# Patient Record
Sex: Male | Born: 1995 | Race: White | Hispanic: No | Smoking: Current every day smoker
Health system: Southern US, Community
[De-identification: ages and names within clinical notes are randomized; demographics above are authoritative.]

## PROBLEM LIST (undated history)

## (undated) DIAGNOSIS — I10 Essential (primary) hypertension: Secondary | ICD-10-CM

## (undated) HISTORY — DX: Essential (primary) hypertension: I10

## (undated) HISTORY — PX: KNEE SURGERY: SHX244

## (undated) HISTORY — PX: WRIST SURGERY: SHX841

---

## 2019-06-11 ENCOUNTER — Telehealth: Payer: Self-pay | Admitting: Physician Assistant

## 2019-06-22 ENCOUNTER — Ambulatory Visit: Payer: Self-pay | Admitting: Physician Assistant

## 2019-06-22 ENCOUNTER — Ambulatory Visit (HOSPITAL_COMMUNITY)
Admission: RE | Admit: 2019-06-22 | Discharge: 2019-06-22 | Disposition: A | Discharge status: Home / Self Care | Payer: Self-pay | Source: Ambulatory Visit | Attending: Physician Assistant | Admitting: Physician Assistant

## 2019-06-22 ENCOUNTER — Encounter: Payer: Self-pay | Admitting: Physician Assistant

## 2019-06-22 ENCOUNTER — Other Ambulatory Visit: Payer: Self-pay

## 2019-06-22 ENCOUNTER — Other Ambulatory Visit (HOSPITAL_COMMUNITY)
Admission: RE | Admit: 2019-06-22 | Discharge: 2019-06-22 | Disposition: A | Discharge status: Home / Self Care | Payer: Self-pay | Source: Ambulatory Visit | Attending: Physician Assistant | Admitting: Physician Assistant

## 2019-06-22 VITALS — BP 156/70 | HR 122 | Temp 97.9°F | Ht 69.0 in | Wt 200.0 lb

## 2019-06-22 DIAGNOSIS — Z7689 Persons encountering health services in other specified circumstances: Secondary | ICD-10-CM

## 2019-06-22 DIAGNOSIS — G8929 Other chronic pain: Secondary | ICD-10-CM

## 2019-06-22 DIAGNOSIS — R000 Tachycardia, unspecified: Secondary | ICD-10-CM | POA: Insufficient documentation

## 2019-06-22 DIAGNOSIS — M5442 Lumbago with sciatica, left side: Secondary | ICD-10-CM | POA: Insufficient documentation

## 2019-06-22 DIAGNOSIS — R030 Elevated blood-pressure reading, without diagnosis of hypertension: Secondary | ICD-10-CM

## 2019-06-22 DIAGNOSIS — F17200 Nicotine dependence, unspecified, uncomplicated: Secondary | ICD-10-CM

## 2019-06-22 LAB — COMPREHENSIVE METABOLIC PANEL
ALT: 19 U/L (ref 0–44)
AST: 15 U/L (ref 15–41)
Albumin: 4.9 g/dL (ref 3.5–5.0)
Alkaline Phosphatase: 73 U/L (ref 38–126)
Anion gap: 12 (ref 5–15)
BUN: 9 mg/dL (ref 6–20)
CO2: 22 mmol/L (ref 22–32)
Calcium: 9.6 mg/dL (ref 8.9–10.3)
Chloride: 106 mmol/L (ref 98–111)
Creatinine, Ser: 0.86 mg/dL (ref 0.61–1.24)
GFR calc Af Amer: >60 mL/min (ref >60)
GFR calc non Af Amer: >60 mL/min (ref >60)
Glucose, Bld: 97 mg/dL (ref 70–99)
Potassium: 3.6 mmol/L (ref 3.5–5.1)
Sodium: 140 mmol/L (ref 135–145)
Total Bilirubin: 0.8 mg/dL (ref 0.3–1.2)
Total Protein: 8.2 g/dL — ABNORMAL HIGH (ref 6.5–8.1)

## 2019-06-22 LAB — TSH: TSH: 0.77 u[IU]/mL (ref 0.350–4.500)

## 2019-06-22 MED ORDER — PREDNISONE 10 MG PO TABS: ORAL_TABLET | ORAL | 0 refills | Status: DC

## 2019-06-22 NOTE — Progress Notes (Signed)
BP (!) 156/70   Pulse (!) 122   Temp 97.9 F (36.6 C)   Ht 5\' 9"  (1.753 m)   Wt 200 lb (90.7 kg)   SpO2 98%   BMI 29.53 kg/m    Subjective:    Patient ID: Joshua Jacobson, male    DOB: 1996/09/07, 23 y.o.   MRN: [id number]  HPI: Joshua Jacobson is a 23 y.o. male presenting on 06/22/2019 for New Patient (Initial Visit) and Back Pain (started about 3 months ago when at work when lifting a tin pan full of springs. )   HPI  Chief Complaint  Patient presents with  . New Patient (Initial Visit)  . Back Pain    started about 3 months ago when at work when lifting a tin pan full of springs.      Pt had negative screeningquestionnairefor CV19   Pt complains of back pain.   He says he got injured while at work.   Pt works at [employer] and stand   and his employer sent him to  urgent care in [place].  He Got 2 rx  (flexeril and IBU)   and was dx with lumbar strain   Pt is not working currently  Pt states Pain averages 4/10,  At times it's a 10.  Now it's about a 4  Pt denies drugs, energy drinks.  Pt says his bp has always run high.    Pt denies CP, SOB.   Back hurts more to the Left.  Pt states it burns into left leg to the foot.  It interferes with his sleep.  No problems walking except it makes it hurt.    Pt says he has always had a fast pulse   Pt lived in [place] until age 40  Relevant past medical, surgical, family and social history reviewed and updated as indicated. Interim medical history since our last visit reviewed. Allergies and medications reviewed and updated.  No current outpatient medications on file.   Review of Systems  Per HPI unless specifically indicated above     Objective:    BP (!) 156/70   Pulse (!) 122   Temp 97.9 F (36.6 C)   Ht 5\' 9"  (1.753 m)   Wt 200 lb (90.7 kg)   SpO2 98%   BMI 29.53 kg/m   Wt Readings from Last 3 Encounters:  06/22/19 200 lb (90.7 kg)    Physical Exam Vitals signs reviewed.   Constitutional:      Appearance: He is well-developed.  HENT:     Head: Normocephalic and atraumatic.  Eyes:     Conjunctiva/sclera: Conjunctivae normal.     Pupils: Pupils are equal, round, and reactive to light.  Neck:     Musculoskeletal: Neck supple.     Thyroid: No thyromegaly.  Cardiovascular:     Rate and Rhythm: Normal rate and regular rhythm.  Pulmonary:     Effort: Pulmonary effort is normal.     Breath sounds: Normal breath sounds. No wheezing or rales.  Abdominal:     General: Bowel sounds are normal.     Palpations: Abdomen is soft. There is no mass.     Tenderness: There is no abdominal tenderness.  Lymphadenopathy:     Cervical: No cervical adenopathy.  Skin:    General: Skin is warm and dry.     Findings: No rash.  Neurological:     Mental Status: He is alert and oriented to person, place, and time.  Psychiatric:  Behavior: Behavior normal.        Thought Content: Thought content normal.      Ekg- ST at 109.  No st-t changes. No previous for comparison   No results found for this or any previous visit.    Assessment & Plan:    Encounter Diagnoses  Name Primary?  . Encounter to establish care Yes  . Elevated blood pressure reading   . Chronic left-sided low back pain with left-sided sciatica   . Tachycardia   . Tobacco use disorder       -will check  Baseline labs  -Will get xray of lumbar spine.  Pt is given rx prednisone taper  -pt is given application [facility]  -discussed blood pressure with pt and he feels like it is usually good and is just up today due to pain and perhaps some nervous.  He does not want to start medication for this today because he feels like it will be normal next time.  Pt is given reading information on HTN  -pt is encouraged to wear mask in public per CDC guidelines  -pt to have follow up in 1 month.  He is to contact office sooner prn

## 2019-06-22 NOTE — Patient Instructions (Signed)

## 2019-07-20 ENCOUNTER — Other Ambulatory Visit: Payer: Self-pay

## 2019-07-20 ENCOUNTER — Encounter: Payer: Self-pay | Admitting: Physician Assistant

## 2019-07-20 ENCOUNTER — Ambulatory Visit: Payer: Self-pay | Admitting: Physician Assistant

## 2019-07-20 VITALS — BP 138/86 | HR 107 | Temp 99.7°F | Wt 188.8 lb

## 2019-07-20 DIAGNOSIS — G8929 Other chronic pain: Secondary | ICD-10-CM

## 2019-07-20 DIAGNOSIS — F17200 Nicotine dependence, unspecified, uncomplicated: Secondary | ICD-10-CM

## 2019-07-20 NOTE — Progress Notes (Signed)
BP 138/86   Pulse (!) 107   Temp 99.7 F (37.6 C)   Wt 188 lb 12.8 oz (85.6 kg)   SpO2 98%   BMI 27.88 kg/m    Subjective:    Patient ID: Joshua Jacobson, male    DOB: 1996/01/26, 23 y.o.   MRN: [id number]  HPI: Joshua Jacobson is a 23 y.o. male presenting on 07/20/2019 for Hypertension and Back Pain   HPI  Pt had a negative covid 19 screening questionnaire    Pt says prednisone that he was given at last office visit helped some but not much.  He is still having LBP with radiation into the left foot and leg  He says he got approved [facility]  He has no other complaints    Relevant past medical, surgical, family and social history reviewed and updated as indicated. Interim medical history since our last visit reviewed. Allergies and medications reviewed and updated.  Review of Systems  Per HPI unless specifically indicated above     Objective:    BP 138/86   Pulse (!) 107   Temp 99.7 F (37.6 C)   Wt 188 lb 12.8 oz (85.6 kg)   SpO2 98%   BMI 27.88 kg/m   Wt Readings from Last 3 Encounters:  07/20/19 188 lb 12.8 oz (85.6 kg)  06/22/19 200 lb (90.7 kg)    Physical Exam Vitals signs reviewed.  Constitutional:      General: He is not in acute distress.    Appearance: Normal appearance. He is well-developed. He is not ill-appearing.  HENT:     Head: Normocephalic and atraumatic.  Neck:     Musculoskeletal: Neck supple.  Cardiovascular:     Rate and Rhythm: Normal rate and regular rhythm.     Pulses:          Dorsalis pedis pulses are 2+ on the right side and 2+ on the left side.  Pulmonary:     Effort: Pulmonary effort is normal.     Breath sounds: Normal breath sounds. No wheezing.  Abdominal:     General: Bowel sounds are normal.     Palpations: Abdomen is soft.     Tenderness: There is no abdominal tenderness.  Musculoskeletal:     Lumbar back: He exhibits tenderness. He exhibits normal range of motion.     Right lower leg: No edema.      Left lower leg: No edema.     Comments: Mild soft tissue tenderness.  Some discomfort with SLR testing past 45 degrees.    Lymphadenopathy:     Cervical: No cervical adenopathy.  Skin:    General: Skin is warm and dry.  Neurological:     Mental Status: He is alert and oriented to person, place, and time.  Psychiatric:        Attention and Perception: Attention normal.        Speech: Speech normal.        Behavior: Behavior is cooperative.        Results for orders placed or performed during the hospital encounter of 06/22/19  Comprehensive metabolic panel  Result Value Ref Range   Sodium 140 135 - 145 mmol/L   Potassium 3.6 3.5 - 5.1 mmol/L   Chloride 106 98 - 111 mmol/L   CO2 22 22 - 32 mmol/L   Glucose, Bld 97 70 - 99 mg/dL   BUN 9 6 - 20 mg/dL   Creatinine, Ser 4.090.86 0.61 - 1.24  mg/dL   Calcium 9.6 8.9 - 10.3 mg/dL   Total Protein 8.2 (H) 6.5 - 8.1 g/dL   Albumin 4.9 3.5 - 5.0 g/dL   AST 15 15 - 41 U/L   ALT 19 0 - 44 U/L   Alkaline Phosphatase 73 38 - 126 U/L   Total Bilirubin 0.8 0.3 - 1.2 mg/dL   GFR calc non Af Amer >60 >60 mL/min   GFR calc Af Amer >60 >60 mL/min   Anion gap 12 5 - 15  TSH  Result Value Ref Range   TSH 0.770 0.350 - 4.500 uIU/mL      Assessment & Plan:    Encounter Diagnoses  Name Primary?  . Chronic left-sided low back pain with left-sided sciatica Yes  . Tobacco use disorder     -reviewed labs with pt.  Reviewed lumbar xray with pt -pt's bp is improved -will Refer to orthopedics for further evaluation and treatment of LBP  He is to use IBU or aleve and ice/heat for analgesic. -counseled smoking cessation -encouraged pt to wear a mask when in public and avoid crowds per CDC guidelines -pt to follow up 1 year.  He is to contact office sooner prn

## 2019-07-28 ENCOUNTER — Telehealth: Payer: Self-pay | Admitting: Orthopedic Surgery

## 2019-07-28 NOTE — Telephone Encounter (Signed)
Aug 10

## 2019-07-28 NOTE — Telephone Encounter (Signed)
Spoke with patient regarding referral received from Soyla Dryer at [facility] for problem of left sided low back pain/left sided sciatica. Xrays were done at [facility]. Please review and advise. (Patient relays he has been approved for [facility] 100%discount.)  [phone].

## 2019-07-30 NOTE — Telephone Encounter (Signed)
Called patient to notify, and to offer appointment.

## 2019-08-06 ENCOUNTER — Other Ambulatory Visit: Payer: Self-pay

## 2019-08-06 ENCOUNTER — Emergency Department (HOSPITAL_COMMUNITY): Payer: Self-pay

## 2019-08-06 ENCOUNTER — Emergency Department (HOSPITAL_COMMUNITY)
Admission: EM | Admit: 2019-08-06 | Discharge: 2019-08-06 | Disposition: A | Discharge status: Home / Self Care | Payer: Self-pay | Attending: Emergency Medicine | Admitting: Emergency Medicine

## 2019-08-06 ENCOUNTER — Encounter (HOSPITAL_COMMUNITY): Payer: Self-pay | Admitting: Emergency Medicine

## 2019-08-06 DIAGNOSIS — I10 Essential (primary) hypertension: Secondary | ICD-10-CM | POA: Insufficient documentation

## 2019-08-06 DIAGNOSIS — R509 Fever, unspecified: Secondary | ICD-10-CM | POA: Insufficient documentation

## 2019-08-06 DIAGNOSIS — Z20828 Contact with and (suspected) exposure to other viral communicable diseases: Secondary | ICD-10-CM | POA: Insufficient documentation

## 2019-08-06 DIAGNOSIS — F17210 Nicotine dependence, cigarettes, uncomplicated: Secondary | ICD-10-CM | POA: Insufficient documentation

## 2019-08-06 LAB — BASIC METABOLIC PANEL
Anion gap: 10 (ref 5–15)
BUN: 12 mg/dL (ref 6–20)
CO2: 23 mmol/L (ref 22–32)
Calcium: 9.1 mg/dL (ref 8.9–10.3)
Chloride: 104 mmol/L (ref 98–111)
Creatinine, Ser: 0.84 mg/dL (ref 0.61–1.24)
GFR calc Af Amer: >60 mL/min (ref >60)
GFR calc non Af Amer: >60 mL/min (ref >60)
Glucose, Bld: 121 mg/dL — ABNORMAL HIGH (ref 70–99)
Potassium: 4.2 mmol/L (ref 3.5–5.1)
Sodium: 137 mmol/L (ref 135–145)

## 2019-08-06 LAB — CBC WITH DIFFERENTIAL/PLATELET
Abs Immature Granulocytes: 0.1 10*3/uL — ABNORMAL HIGH (ref 0.00–0.07)
Basophils Absolute: 0.1 10*3/uL (ref 0.0–0.1)
Basophils Relative: 1 %
Eosinophils Absolute: 0 10*3/uL (ref 0.0–0.5)
Eosinophils Relative: 0 %
HCT: 49.7 % (ref 39.0–52.0)
Hemoglobin: 16.9 g/dL (ref 13.0–17.0)
Immature Granulocytes: 1 %
Lymphocytes Relative: 41 %
Lymphs Abs: 4 10*3/uL (ref 0.7–4.0)
MCH: 30.3 pg (ref 26.0–34.0)
MCHC: 34 g/dL (ref 30.0–36.0)
MCV: 89.1 fL (ref 80.0–100.0)
Monocytes Absolute: 0.4 10*3/uL (ref 0.1–1.0)
Monocytes Relative: 4 %
Neutro Abs: 5.1 10*3/uL (ref 1.7–7.7)
Neutrophils Relative %: 53 %
Platelets: 168 10*3/uL (ref 150–400)
RBC: 5.58 MIL/uL (ref 4.22–5.81)
RDW: 12.9 % (ref 11.5–15.5)
WBC Morphology: ABNORMAL
WBC: 9.8 10*3/uL (ref 4.0–10.5)
nRBC: 0 % (ref 0.0–0.2)

## 2019-08-06 LAB — SARS CORONAVIRUS 2 BY RT PCR (HOSPITAL ORDER, PERFORMED IN ~~LOC~~ HOSPITAL LAB): SARS Coronavirus 2: NEGATIVE

## 2019-08-06 MED ORDER — DOXYCYCLINE HYCLATE 100 MG PO CAPS: 100.0000 mg | ORAL_CAPSULE | Freq: Two times a day (BID) | ORAL | 0 refills | Status: AC

## 2019-08-06 MED ORDER — DOXYCYCLINE HYCLATE 100 MG PO TABS
100.0000 mg | ORAL_TABLET | Freq: Once | ORAL | Status: AC
  Administered 2019-08-06: 22:00:00 100 mg via ORAL
  Filled 2019-08-06: qty 1

## 2019-08-06 NOTE — ED Triage Notes (Signed)
Fever x 4 days, highest temp 104.  Pt has been taking some leftover abx and prednisone.  Also taking asa for fever.  Denies any other s/s.  Last took 2 asa 1430 (unsure of the mg)

## 2019-08-06 NOTE — Discharge Instructions (Signed)
Your lab tests so far are normal here, as is your chest x-ray.  As discussed there are some lab tests that are still pending including testing for possible tick illness such as Lyme disease or [facility] spotted fever.  Your blood cultures are also still pending.  While we are waiting for these test to come back start taking the doxycycline as prescribed taking your next dose tomorrow morning as you have received this evening's dose here.  Continue treating your fever with Tylenol or ibuprofen.  Plan to see your doctor for recheck if your fevers persist or you develop any new symptoms.

## 2019-08-06 NOTE — ED Provider Notes (Signed)
[facility] EMERGENCY DEPARTMENT Provider Note   CSN: [id number] Arrival date & time: 08/06/19  1719    History   Chief Complaint Chief Complaint  Patient presents with  . Fever    HPI Joshua Jacobson is a 23 y.o. male with a past medical history of HTN, not currently on medication and current chronic low back pain from a work related injury presenting with a 4 day history of fevers with tmax of 104. He reports his fevers are gone during the day, but return in the evening hours nightly x 4 days.  Additionally reports a 3 day history of n/v which seems to have resolved today as he has been able to tolerate PO intake today. He denies cp or sob, he does have non productive cough which he states is his chronic smokers cough.  He denies abdominal pain, dysuria, back or flank pain, no diarrhea. Also denies sore throat, neck pain or stiffness, headache or myalgia, no rash, denies tick bites but does have a dog and frequently removes ticks from him.  No Covid 19 exposure.  He does have chronic dental decay but denies dental pain or gingival swelling.  He has been taking prednisone which was prescribed for his back pain, also has taken leftover amoxil for the past 3 days.  Has taken aspirin as well with adequate fever response, last took just prior to arrival.      The history is provided by the patient.    Past Medical History:  Diagnosis Date  . Hypertension     There are no active problems to display for this patient.   Past Surgical History:  Procedure Laterality Date  . KNEE SURGERY Right    x4  . WRIST SURGERY Right         Home Medications    Prior to Admission medications   Not on File    Family History Family History  Problem Relation Age of Onset  . Hypertension Mother   . Heart disease Mother   . Hypertension Father   . Heart disease Father   . Asthma Father     Social History Social History   Tobacco Use  . Smoking status: Current Every Day Smoker   Packs/day: 0.25    Years: 12.00    Pack years: 3.00  . Smokeless tobacco: Former NeurosurgeonUser    Types: Chew    Quit date: 2010  Substance Use Topics  . Alcohol use: Yes    Comment: occasional beer  . Drug use: Never     Allergies   Codeine   Review of Systems Review of Systems  Constitutional: Positive for diaphoresis, fatigue and fever.  HENT: Negative for congestion, facial swelling, rhinorrhea and sore throat.   Eyes: Negative.   Respiratory: Negative for chest tightness and shortness of breath.   Cardiovascular: Negative for chest pain.  Gastrointestinal: Positive for nausea and vomiting. Negative for abdominal pain and diarrhea.  Genitourinary: Negative.  Negative for dysuria.  Musculoskeletal: Positive for back pain. Negative for arthralgias, joint swelling, neck pain and neck stiffness.  Skin: Negative.  Negative for rash and wound.  Neurological: Negative for dizziness, weakness, light-headedness, numbness and headaches.  Psychiatric/Behavioral: Negative.      Physical Exam Updated Vital Signs BP 133/73   Pulse 100   Temp 99.9 F (37.7 C) (Oral)   Resp 18   Ht 5\' 9"  (1.753 m)   Wt 90.7 kg   SpO2 100%   BMI 29.53 kg/m   Physical  Exam Vitals signs and nursing note reviewed.  Constitutional:      General: He is not in acute distress.    Appearance: He is well-developed. He is diaphoretic.  HENT:     Head: Normocephalic and atraumatic.     Nose: No congestion or rhinorrhea.     Mouth/Throat:     Mouth: Mucous membranes are moist.     Pharynx: No oropharyngeal exudate or posterior oropharyngeal erythema.     Comments: Mild chronic dental decay right lower molars. No gingival edema.  Eyes:     Conjunctiva/sclera: Conjunctivae normal.  Neck:     Musculoskeletal: Normal range of motion.  Cardiovascular:     Rate and Rhythm: Regular rhythm. Tachycardia present.     Heart sounds: Normal heart sounds.  Pulmonary:     Effort: Pulmonary effort is normal.      Breath sounds: Normal breath sounds. No wheezing.  Abdominal:     General: Bowel sounds are normal.     Palpations: Abdomen is soft.     Tenderness: There is no abdominal tenderness.  Musculoskeletal: Normal range of motion.  Skin:    General: Skin is warm.  Neurological:     Mental Status: He is alert.      ED Treatments / Results  Labs (all labs ordered are listed, but only abnormal results are displayed) Labs Reviewed  CBC WITH DIFFERENTIAL/PLATELET - Abnormal; Notable for the following components:      Result Value   Abs Immature Granulocytes 0.10 (*)    All other components within normal limits  BASIC METABOLIC PANEL - Abnormal; Notable for the following components:   Glucose, Bld 121 (*)    All other components within normal limits  SARS CORONAVIRUS 2 (HOSPITAL ORDER, PERFORMED IN [address] [facility])  CULTURE, BLOOD (ROUTINE X 2)  CULTURE, BLOOD (ROUTINE X 2)  ROCKY MTN SPOTTED FVR ABS PNL(IGG+IGM)  LYME DISEASE DNA BY PCR(BORRELIA BURG)    EKG None  Radiology Dg Chest Portable 1 View  Result Date: 08/06/2019 CLINICAL DATA:  Fever.  Cough. EXAM: PORTABLE CHEST 1 VIEW COMPARISON:  None. FINDINGS: The heart size and mediastinal contours are within normal limits. Both lungs are clear. The visualized skeletal structures are unremarkable. IMPRESSION: Normal. Electronically Signed   By: Francene BoyersJames  Maxwell M.D.   On: 08/06/2019 18:51    Procedures Procedures (including critical care time)  Medications Ordered in ED Medications - No data to display   Initial Impression / Assessment and Plan / ED Course  I have reviewed the triage vital signs and the nursing notes.  Pertinent labs & imaging results that were available during my care of the patient were reviewed by me and considered in my medical decision making (see chart for details).        Febrile illness of unclear etiology.  He has no specific sx except for fever.  Covid 10 negative today and normal blood  work.  He has potential for tick borne illness, stating has a dog, last tick he was removed (from the dog) was 3 weeks ago, to his knowledge has not had a tick bite.  RMSF and Lyme ordered as well as blood cultures.  Pt looked clinically well, will cover with doxycycline pending lab tests.  Plan f/u with pcp for a recheck for any new or persistent sx in the interim.   Final Clinical Impressions(s) / ED Diagnoses   Final diagnoses:  Febrile illness    ED Discharge Orders  None       Landis Martins 08/06/19 2318    Milton Ferguson, MD 08/07/19 [id number]

## 2019-08-10 ENCOUNTER — Other Ambulatory Visit: Payer: Self-pay

## 2019-08-10 ENCOUNTER — Encounter: Payer: Self-pay | Admitting: Orthopedic Surgery

## 2019-08-10 ENCOUNTER — Ambulatory Visit (INDEPENDENT_AMBULATORY_CARE_PROVIDER_SITE_OTHER): Payer: Self-pay | Admitting: Orthopedic Surgery

## 2019-08-10 ENCOUNTER — Telehealth: Payer: Self-pay | Admitting: Student

## 2019-08-10 VITALS — BP 130/80 | HR 105 | Ht 69.0 in | Wt 196.0 lb

## 2019-08-10 DIAGNOSIS — M5410 Radiculopathy, site unspecified: Secondary | ICD-10-CM

## 2019-08-10 MED ORDER — GABAPENTIN 100 MG PO CAPS: 100.0000 mg | ORAL_CAPSULE | Freq: Three times a day (TID) | ORAL | 2 refills | Status: AC

## 2019-08-10 NOTE — Progress Notes (Signed)
Joshua Jacobson  08/10/2019  HISTORY SECTION :  Chief Complaint  Patient presents with  . Back Pain    injury 03/10/2019/lifting a pan of [facility]   . Leg Pain    pain down left leg    HPI The patient presents for evaluation of lower back and left leg pain.  The patient picked up something at work felt something in his back felt acute pain let the object go and to go home that day but after a couple of days was not better eventually saw PA who gave him some prednisone took an x-ray x-ray did not show any acute process there was some degenerative disease  He now has 5032-month history of lower back and left leg radicular pain 3 at rest but becomes a 10 with coughing or with walking  Review of Systems  Musculoskeletal: Positive for back pain.  Neurological: Positive for sensory change. Negative for weakness.  All other systems reviewed and are negative.    Past Medical History:  Diagnosis Date  . Hypertension     Past Surgical History:  Procedure Laterality Date  . KNEE SURGERY Right    x4  . WRIST SURGERY Right      Allergies  Allergen Reactions  . Codeine Rash     Current Outpatient Medications:  .  doxycycline (VIBRAMYCIN) 100 MG capsule, Take 1 capsule (100 mg total) by mouth 2 (two) times daily., Disp: 20 capsule, Rfl: 0 .  gabapentin (NEURONTIN) 100 MG capsule, Take 1 capsule (100 mg total) by mouth 3 (three) times daily., Disp: 90 capsule, Rfl: 2   PHYSICAL EXAM SECTION: 1) BP 130/80   Pulse (!) 105   Ht 5\' 9"  (1.753 m)   Wt 196 lb (88.9 kg)   BMI 28.94 kg/m   Body mass index is 28.94 kg/m. General appearance: Well-developed well-nourished no gross deformities  2) Cardiovascular normal pulse and perfusion in all 4 extremities normal color without edema  3) Neurologically deep tendon reflexes are equal and normal, no sensation loss or deficits no pathologic reflexes  4) Psychological: Awake alert and oriented x3 mood and  affect normal  5) Skin no lacerations or ulcerations no nodularity no palpable masses, no erythema or nodularity  6) Musculoskeletal:   Lumbar spine tender midline lower and left side  Left leg straight leg is positive at 30 degrees.  Both legs have normal strength hip range of motion is normal bilaterally reflexes are 2+ at the knee 1+ to 2+ at the ankle bilaterally pulses are intact color capillary refill good no edema  MEDICAL DECISION SECTION:  Encounter Diagnosis  Name Primary?  . Radicular leg pain Yes    Imaging Outside film x-rays multiple views x5 lumbar spine no acute fracture or dislocation  See report included by reference  CLINICAL DATA:  Back pain.   EXAM: LUMBAR SPINE - COMPLETE 4+ VIEW   COMPARISON:  No prior.   FINDINGS: No acute soft tissue bony abnormality. No evidence of fracture. Mild degenerative changes L3-L4, L4-L5, L5-S1.   IMPRESSION: Mild degenerative changes L3-L4, L4-L5, L5-S1. No acute bony abnormality.     Electronically Signed   By: Maisie Fushomas  Register   On: 06/23/2019 08:24    Plan:  (Rx., Inj., surg., Frx, MRI/CT, XR:2)  Meds ordered this encounter  Medications  . gabapentin (NEURONTIN) 100 MG capsule    Sig: Take 1 capsule (100 mg total) by mouth 3 (three) times daily.  Dispense:  90 capsule    Refill:  2   MRI lumbar spine probable disc herniationMost likely L4-5  1:56 PM Arther Abbott, MD  08/10/2019

## 2019-08-10 NOTE — Patient Instructions (Signed)
Herniated Disk  A herniated disk, also called a ruptured disk or slipped disk, occurs when a disk in the spine bulges out too far. Between the bones in the spine (vertebrae), there are oval disks that are made of a soft, spongy center that is surrounded by a tough outer ring. The disks connect your vertebrae, help your spine move, and absorb shocks from your movement. When you have a herniated disk, the spongy center of the disk bulges out or breaks through the outer ring. It can press on a nerve between the vertebrae and cause pain. This can occur anywhere in the back or neck area, but the lower back is most commonly affected. What are the causes? This condition may be caused by:  Age-related wear and tear. The spongy centers of spinal disks tend to shrink and dry out with age, which makes them more likely to herniate.  Sudden injury, such as a strain or sprain. What increases the risk? Aging is the main risk factor for a herniated disk. Other risk factors include:  Being a man who is 30-50 years old.  Frequently doing activities that involve heavy lifting, bending, or twisting.  Frequently driving for long hours at a time.  Not getting enough exercise.  Being overweight.  Smoking.  Having a family history of back problems or herniated disks.  Being pregnant or giving birth.  Having poor nutrition.  Being tall. What are the signs or symptoms? Symptoms may vary depending on where your herniated disk is located.  A herniated disk in the lower back may cause sharp pain in: ? Part of the arm, leg, hip, or buttocks. ? The back of the lower leg (calf). ? The lower back, spreading down through the leg into the foot (sciatica).  A herniated disk in the neck may cause dizziness and vertigo. It may also cause pain or weakness in: ? The neck. ? The shoulder blades. ? Upper arm, forearm, or fingers.  You may also have muscle weakness. It may be difficult to: ? Lift your leg or arm.  ? Stand on your toes. ? Squeeze tightly with one of your hands.  Other symptoms may include: ? Numbness or tingling in the affected areas of the hands, arms, feet, or legs. ? Inability to control when you urinate or when you have bowel movements. This is a rare but serious sign of a severe herniated disk in the lower back. How is this diagnosed? This condition may be diagnosed based on:  Your symptoms.  Your medical history.  A physical exam. The exam may include: ? Straight-leg test. You will lie on your back while your health care provider lifts your leg, keeping your knee straight. If you feel pain, you likely have a herniated disk. ? Neurological tests. This includes checking for numbness, reflexes, muscle strength, and posture.  Imaging tests, such as: ? X-rays. ? MRI. ? CT scan. ? Electromyogram (EMG) to check the nerves that control muscles. This test may be used to determine which nerves are affected by your herniated disk. How is this treated? Treatment for this condition may include:  A short period of rest. This is usually the first treatment. ? You may be on bed rest for up to 2 days, or you may be instructed to stay home and avoid physical activity. ? If you have a herniated disk in your lower back, avoid sitting as much as possible. Sitting increases pressure on the disk.  Medicines. These may include: ? NSAIDs   to help reduce pain and swelling. ? Muscle relaxants to prevent sudden tightening of the back muscles (back spasms). ? Prescription pain medicines, if you have severe pain.  Steroid injections in the area of the herniated disk. This can help reduce pain and swelling.  Physical therapy to strengthen your back muscles. In many cases, symptoms go away with treatment over a period of days or weeks. You will most likely be free of symptoms after 3-4 months. If other treatments do not help to relieve your symptoms, you may need surgery. Follow these instructions  at home: Medicines  Take over-the-counter and prescription medicines only as told by your health care provider.  Do not drive or use heavy machinery while taking prescription pain medicine. Activity  Rest as directed.  After your rest period: ? Return to your normal activities and gradually begin exercising as told by your health care provider. Ask your health care provider what activities and exercises are safe for you. ? Use good posture. ? Avoid movements that cause pain. ? Do not lift anything that is heavier than 10 lb (4.5 kg) until your health care provider says this is safe. ? Do not sit or stand for long periods of time without changing positions. ? Do not sit for long periods of time without getting up and moving around.  If physical therapy was prescribed, do exercises as instructed.  Aim to strengthen muscles in your back and abdomen with exercises like crunches, swimming, or walking. General instructions  Do not use any products that contain nicotine or tobacco, such as cigarettes and e-cigarettes. These products can delay healing. If you need help quitting, ask your health care provider.  Do not wear high-heeled shoes.  Do not sleep on your belly.  If you are overweight, work with your health care provider to lose weight safely.  To prevent or treat constipation while you are taking prescription pain medicine, your health care provider may recommend that you: ? Drink enough fluid to keep your urine clear or pale yellow. ? Take over-the-counter or prescription medicines. ? Eat foods that are high in fiber, such as fresh fruits and vegetables, whole grains, and beans. ? Limit foods that are high in fat and processed sugars, such as fried and sweet foods.  Keep all follow-up visits as told by your health care provider. This is important. How is this prevented?   Maintain a healthy weight.  Try to avoid stressful situations.  Maintain physical fitness. Do at  least 150 minutes of moderate-intensity exercise each week, such as brisk walking or water aerobics.  When lifting objects: ? Keep your feet at least shoulder-width apart and tighten your abdominal muscles. ? Keep your spine neutral as you bend your knees and hips. It is important to lift using the strength of your legs, not your back. Do not lock your knees straight out. ? Always ask for help to lift heavy or awkward objects. Contact a health care provider if:  You have back pain or neck pain that does not get better after 6 weeks.  You have severe pain in your back, neck, legs, or arms.  You develop numbness, tingling, or weakness in any part of your body. Get help right away if:  You cannot move your arms or legs.  You cannot control when you urinate or have bowel movements.  You feel dizzy or you faint.  You have shortness of breath. This information is not intended to replace advice given to you by   your health care provider. Make sure you discuss any questions you have with your health care provider. Document Released: 12/14/2000 Document Revised: 11/29/2017 Document Reviewed: 08/13/2016 Elsevier Patient Education  2020 [employer]  

## 2019-08-11 LAB — CULTURE, BLOOD (ROUTINE X 2)
Culture: NO GROWTH
Special Requests: ADEQUATE

## 2019-08-11 LAB — ROCKY MTN SPOTTED FVR ABS PNL(IGG+IGM)
RMSF IgG: NEGATIVE
RMSF IgM: 0.25 {index} (ref 0.00–0.89)

## 2019-08-12 ENCOUNTER — Ambulatory Visit: Payer: Self-pay | Admitting: Orthopedic Surgery

## 2019-08-12 NOTE — Telephone Encounter (Signed)
Pt has not called back.  Closing encounter

## 2019-08-14 ENCOUNTER — Other Ambulatory Visit: Payer: Self-pay

## 2019-08-14 ENCOUNTER — Ambulatory Visit (HOSPITAL_COMMUNITY)
Admission: RE | Admit: 2019-08-14 | Discharge: 2019-08-14 | Disposition: A | Discharge status: Home / Self Care | Payer: Self-pay | Source: Ambulatory Visit | Attending: Orthopedic Surgery | Admitting: Orthopedic Surgery

## 2019-08-14 DIAGNOSIS — M5410 Radiculopathy, site unspecified: Secondary | ICD-10-CM | POA: Insufficient documentation

## 2019-08-19 LAB — CULTURE, BLOOD (ROUTINE X 2)
Culture: NO GROWTH
Special Requests: ADEQUATE

## 2019-08-21 ENCOUNTER — Telehealth: Payer: Self-pay | Admitting: Orthopedic Surgery

## 2019-08-21 DIAGNOSIS — M5410 Radiculopathy, site unspecified: Secondary | ICD-10-CM

## 2019-08-21 NOTE — Telephone Encounter (Signed)
Patient called about his MRI results from 08/14/19. Relays that he had phone problems, and was unsure if he was called.  Phone number (working again) [phone].

## 2019-08-24 NOTE — Telephone Encounter (Signed)
IMPRESSION: 1. 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 2. Additional left paracentral disc protrusion at L5-S1 narrows the left subarticular recess. Correlate for left S1 radiculopathy.   Electronically Signed   By: Marisue Brooklyn.D.

## 2019-08-25 NOTE — Telephone Encounter (Signed)
-----   Message from Carole Civil, MD sent at 08/25/2019 12:39 PM EDT ----- Needs referral to neurosurgery

## 2019-08-26 ENCOUNTER — Telehealth: Payer: Self-pay | Admitting: Orthopedic Surgery

## 2019-08-26 NOTE — Telephone Encounter (Signed)
Leah from [facility] called.  She said they cant accept with Medicaid Family Planning for this patient.  She asks that you call her  Leah--[facility] [phone] ext [phone]  Thanks

## 2019-08-27 ENCOUNTER — Telehealth: Payer: Self-pay | Admitting: Radiology

## 2019-08-27 NOTE — Telephone Encounter (Signed)
They will not accept hi, sent message to see if free clinic can help.

## 2019-08-27 NOTE — Telephone Encounter (Signed)
Called patient to advise referral sent to [facility]  He can call to schedule  Information faxed [phone]  Phone number to schedule is [phone]   Patient states he will call his lawyer I told him he needs to call [facility] to make appointment, he voiced understanding.

## 2019-08-27 NOTE — Telephone Encounter (Signed)
I can send to [facility], is this acceptable with you?

## 2019-08-27 NOTE — Telephone Encounter (Signed)
Neurosurgery has declined referral Patient is aware Dr Aline Brochure is aware Reached out to free clinic to see if they can help.

## 2019-08-27 NOTE — Telephone Encounter (Signed)
Left message for patient  Sent message to Soyla Dryer to see if she can help [place] with this, he needs neurosurgery referral.

## 2019-08-27 NOTE — Telephone Encounter (Signed)
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  I have advised patient, and sending message back to the free clinic, since they referred him, perhaps they can help get him in with a neurosurgeon.    Can you help with this?

## 2019-08-27 NOTE — Telephone Encounter (Signed)
He is self pay. I put this on his paperwork. Of course this is not family planning.   Need to call back after 9 am they are not open yet.

## 2019-08-31 ENCOUNTER — Telehealth: Payer: Self-pay | Admitting: Radiology

## 2019-08-31 NOTE — Telephone Encounter (Signed)
I have discussed with Abigail Butts. Normally primary care providers will help with these situations. Abigail Butts is going to help with the [facility] referral, she will contact our [facility] office and see if she can get the contact information and fax to the clinic.

## 2019-08-31 NOTE — Telephone Encounter (Signed)
Referral faxed now to [facility].  Will follow-up.

## 2019-08-31 NOTE — Telephone Encounter (Addendum)
I have advised patient of referral status, this is declined, but we are working on getting him set up elsewhere.

## 2019-08-31 NOTE — Telephone Encounter (Signed)
Can we discuss? 

## 2019-08-31 NOTE — Telephone Encounter (Signed)
[facility] has declined referral, they are not accepting external self pay patients  I am reaching out to [facility], to see if her clinic can get him set up for a neurosurgeon, since the [facility] and the [facility] have both declined him.

## 2019-09-11 ENCOUNTER — Telehealth: Payer: Self-pay | Admitting: Radiology

## 2019-09-11 NOTE — Telephone Encounter (Signed)
I called patient and s/w him about his referral to [facility].  See referral notes for details.   He is asking for a work note and said also that his lawyer's office was to have sent a request to [place] for his medical records.  He would like the work note sent with the medical records to the lawyer.  Call him with any questions, thanks.

## 2019-09-14 NOTE — Telephone Encounter (Signed)
Yes  And tell him to go to [facility]

## 2019-09-14 NOTE — Telephone Encounter (Signed)
Ok but my advice is to go to the er

## 2019-09-14 NOTE — Telephone Encounter (Signed)
He has [employer] lumbar / self pay, we have had difficulty finding a neurosurgeon to evaluate him, and his primary care will not assist.  He needs work note. Ok to provide out of work note until he can be evaluated by Neurosurgeon?

## 2019-09-14 NOTE — Telephone Encounter (Signed)
He has been told by his attorney to file Workers comp, there is a hearing of some sort scheduled for 10/08/19

## 2019-09-15 NOTE — Telephone Encounter (Signed)
I advised him ER is appropriate if he continues pain, and needs attention sooner than he can schedule. He voiced understanding  Work note at front desk he will pick up

## 2020-05-31 IMAGING — DX LUMBAR SPINE - COMPLETE 4+ VIEW
5 series · 5 of 5 positions shown · non-contrast
Comparison: No prior.

CLINICAL DATA: Back pain.

EXAM:
LUMBAR SPINE - COMPLETE 4+ VIEW

[l-spine ap]
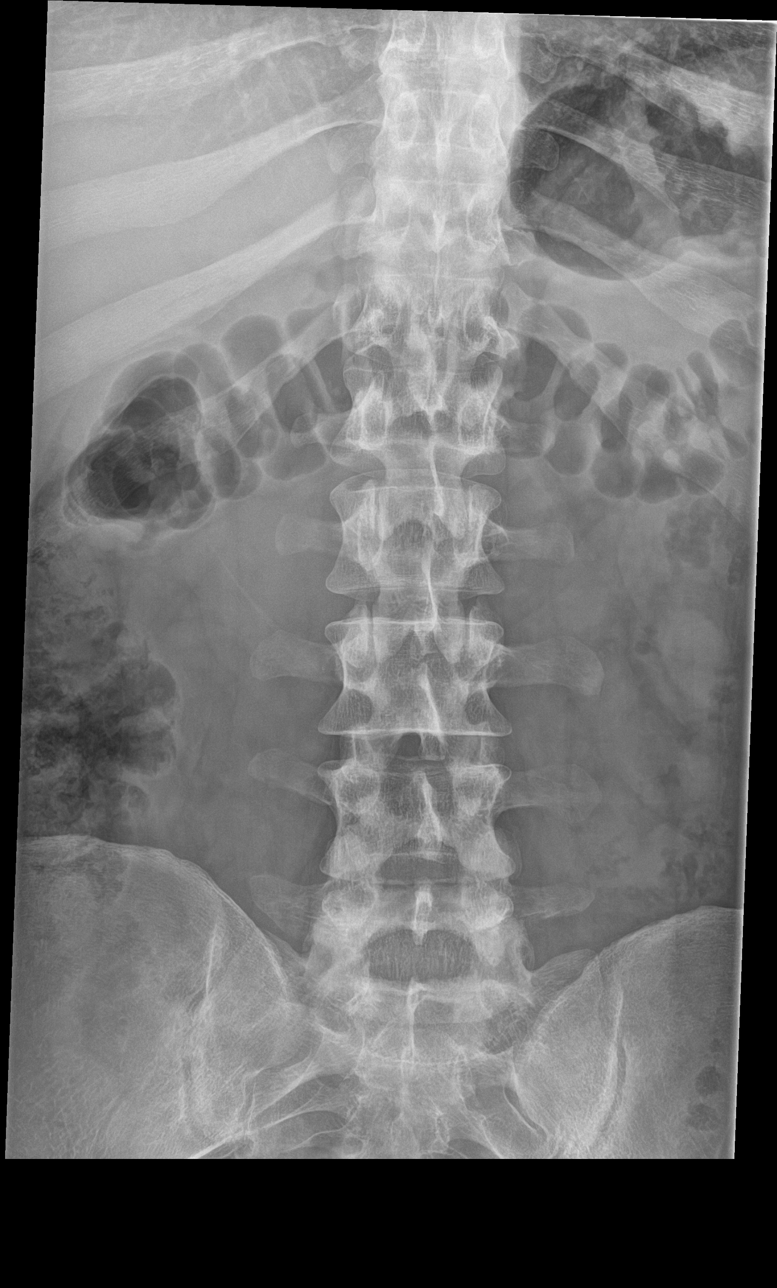

[l-spine obl (1 of 2)]
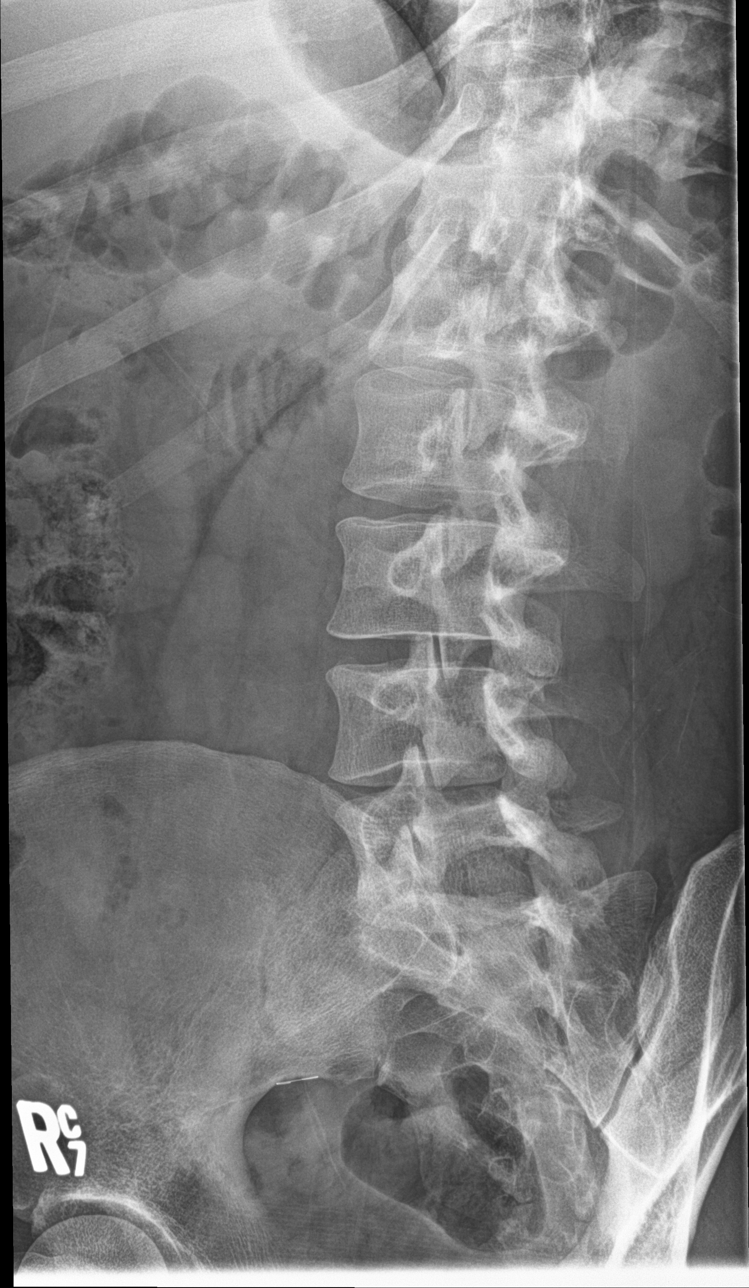

[l-spine obl (2 of 2)]
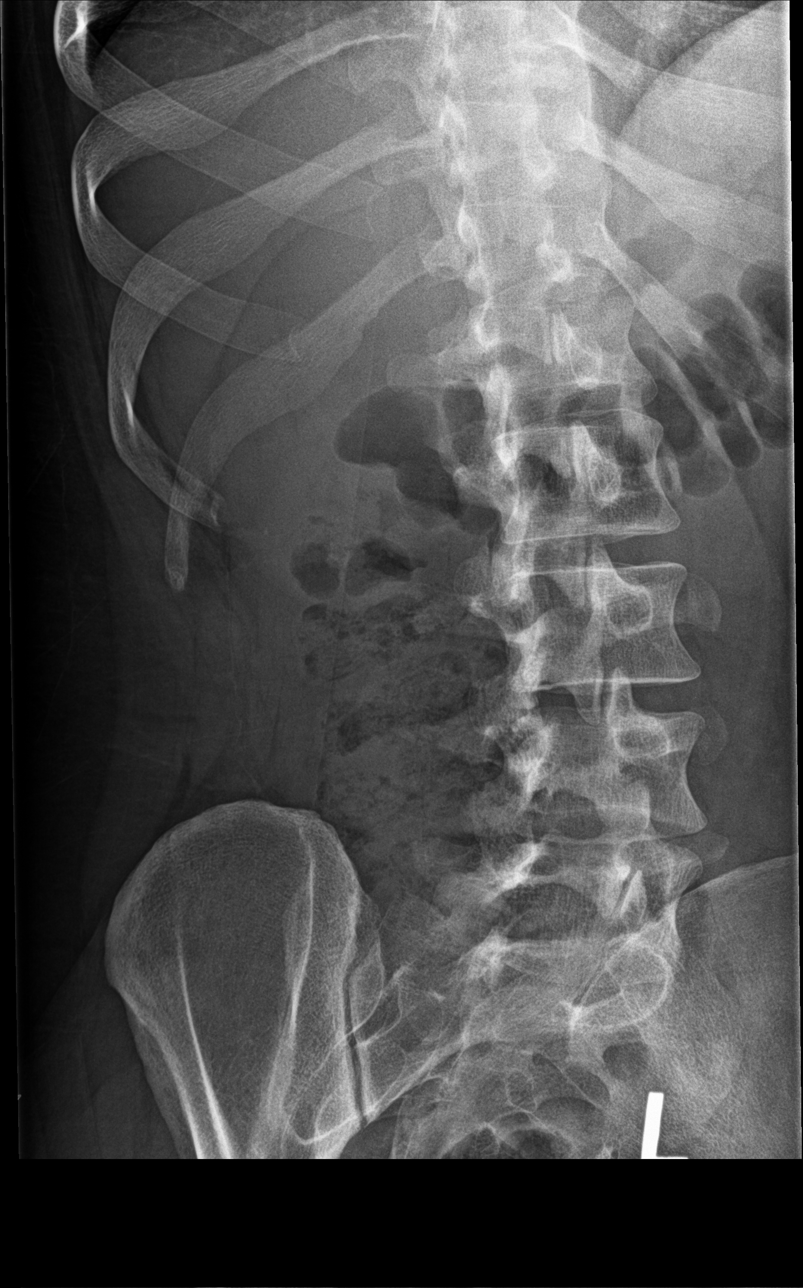

[l-spine lat]
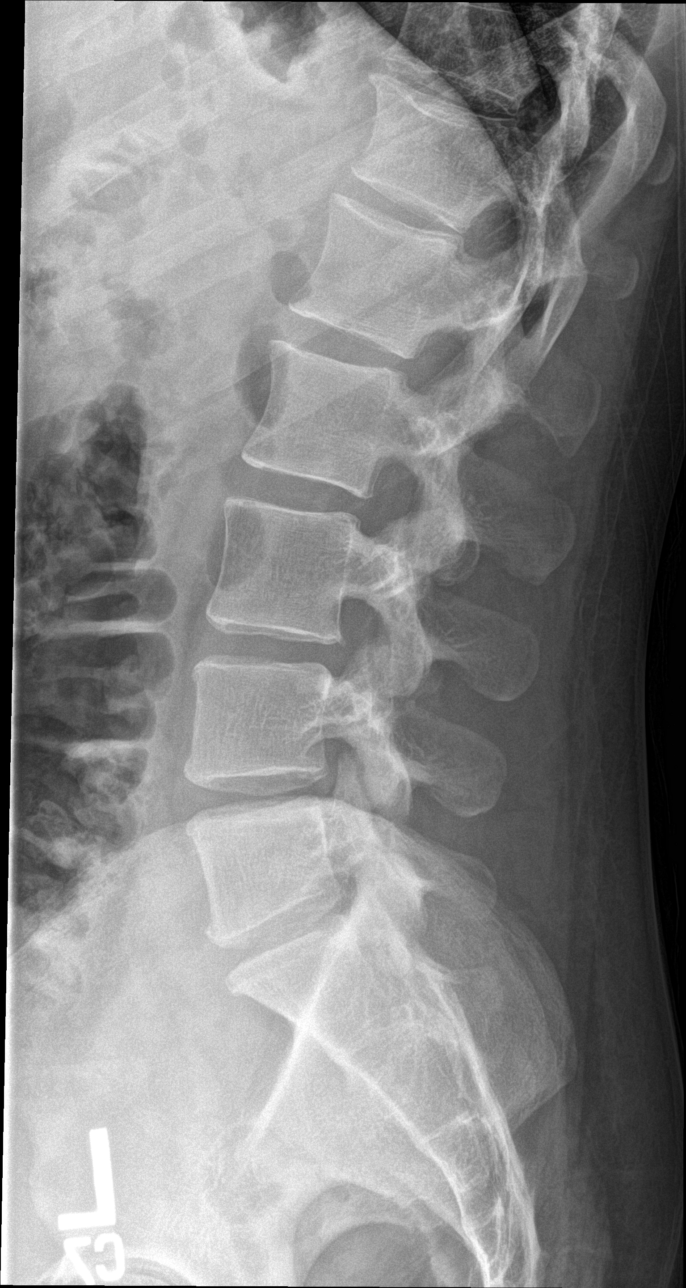

[l-spine spot]
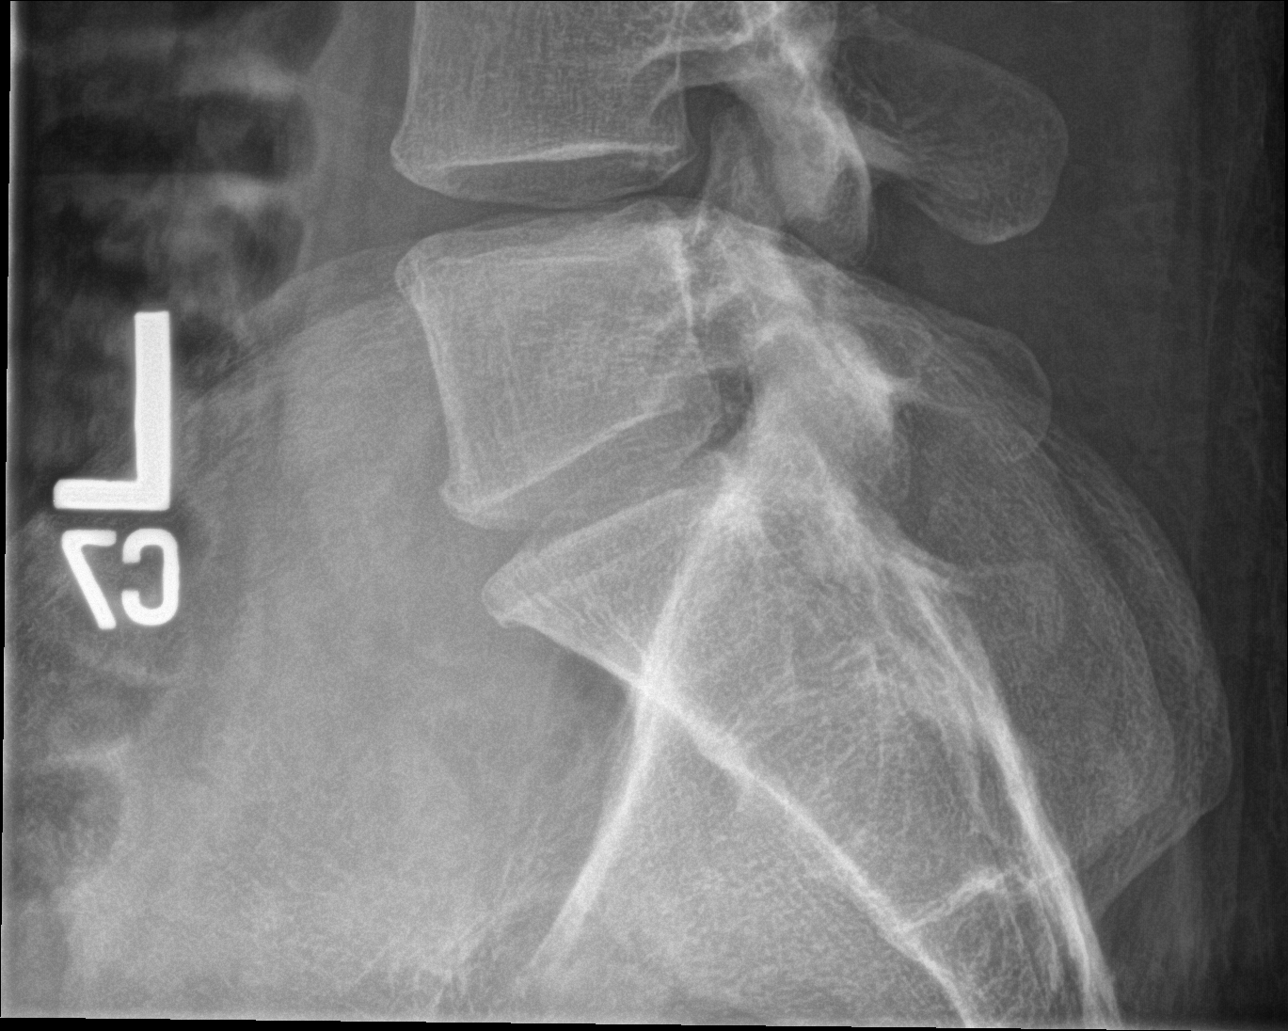

[5 of 5 positions shown; findings below may reference images not displayed]

FINDINGS: No acute soft tissue bony abnormality. No evidence of fracture. Mild
degenerative changes L3-L4, L4-L5, L5-S1.
IMPRESSION: Mild degenerative changes L3-L4, L4-L5, L5-S1. No acute bony
abnormality.

## 2020-07-19 ENCOUNTER — Ambulatory Visit: Payer: Self-pay | Admitting: Physician Assistant

## 2020-07-23 IMAGING — MR MRI LUMBAR SPINE WITHOUT CONTRAST
4 of 5 series · 13 of 48 positions shown · non-contrast
Comparison: X-ray 06/22/2019

CLINICAL DATA: Low back pain with left-sided radiculopathy

EXAM:
MRI LUMBAR SPINE WITHOUT CONTRAST
TECHNIQUE: Multiplanar, multisequence MR imaging of the lumbar spine was
performed. No intravenous contrast was administered.

[Series 3: T2 · sagittal · 4.0mm · 0.42mm/px · 4 of 15 slices shown (1 of 2)]
[im 1/15]
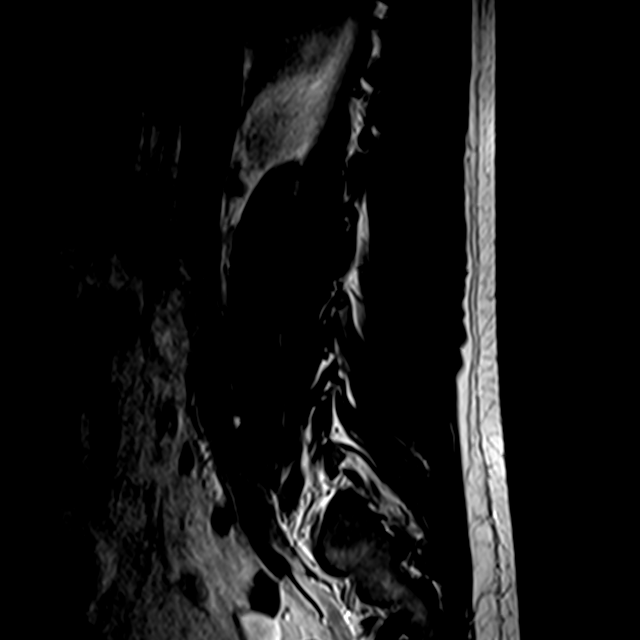
[im 3/15]
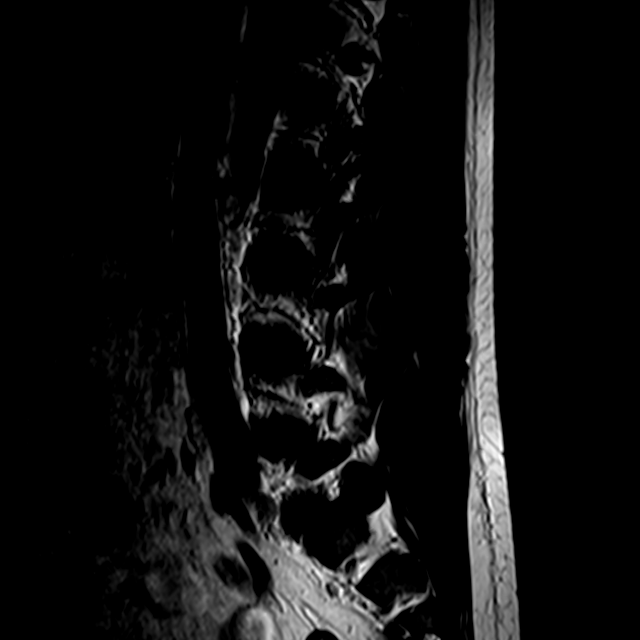
[im 9/15]
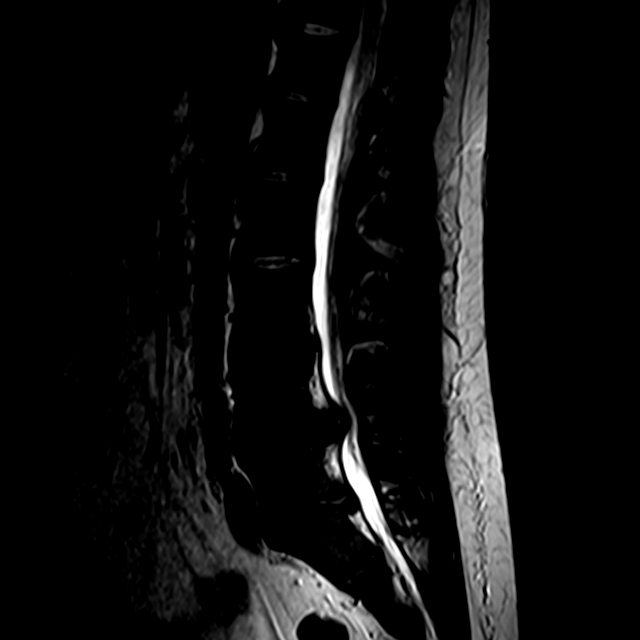
[im 15/15]
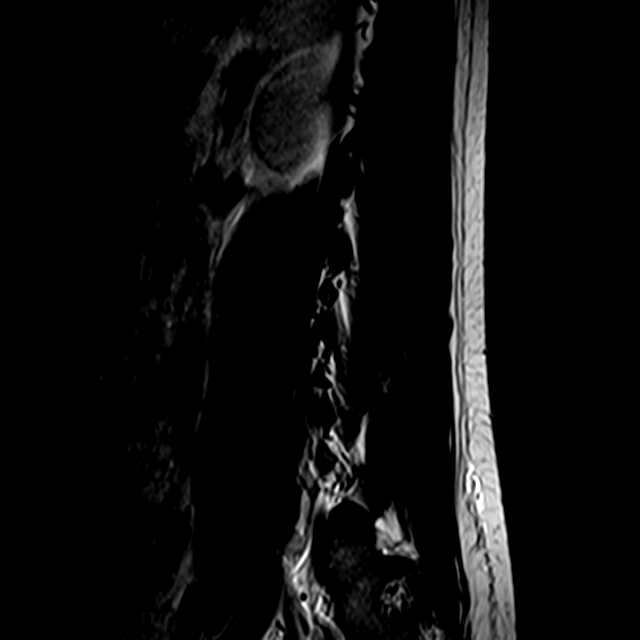

[Series 4: T1 · sagittal · 4.0mm · 0.42mm/px · 3 of 15 slices shown (1 of 2)]
[im 1/15]
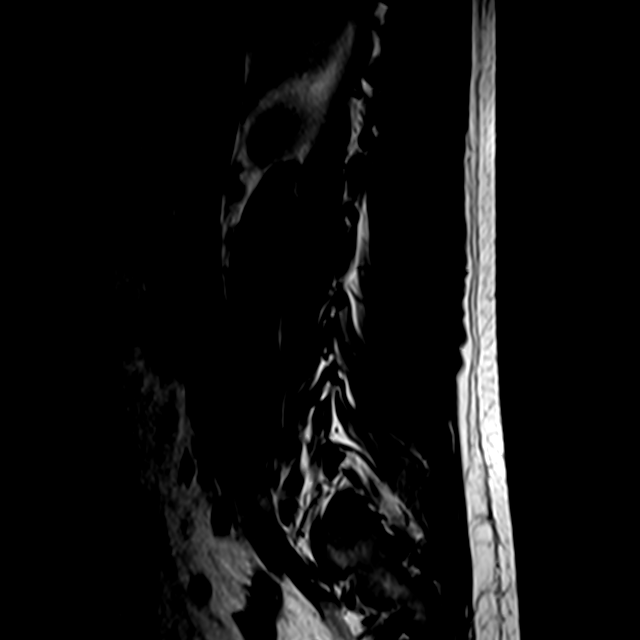
[im 8/15]
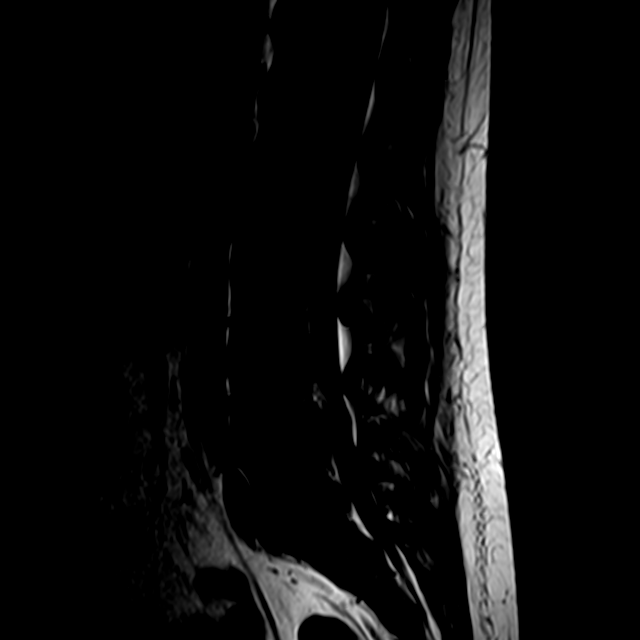
[im 15/15]
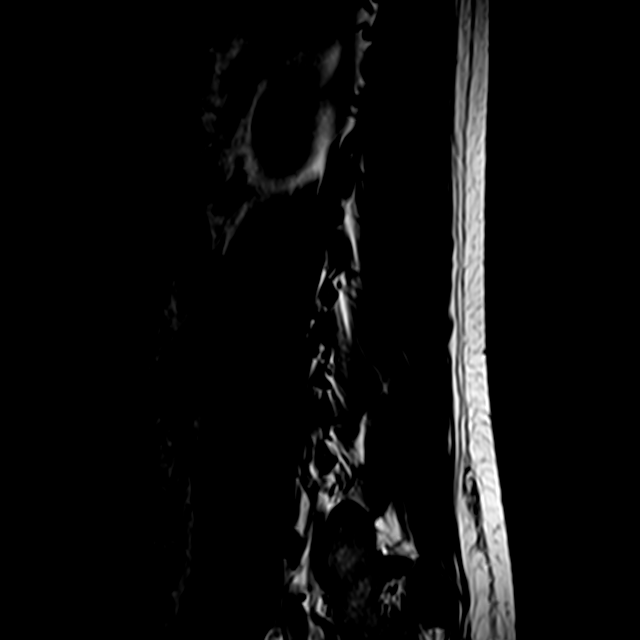

[Series 6: T2 · axial · 4.0mm · 0.24mm/px · z∈[-110,+38]mm · 3 of 42 slices shown (2 of 2)]
[im 6/42]
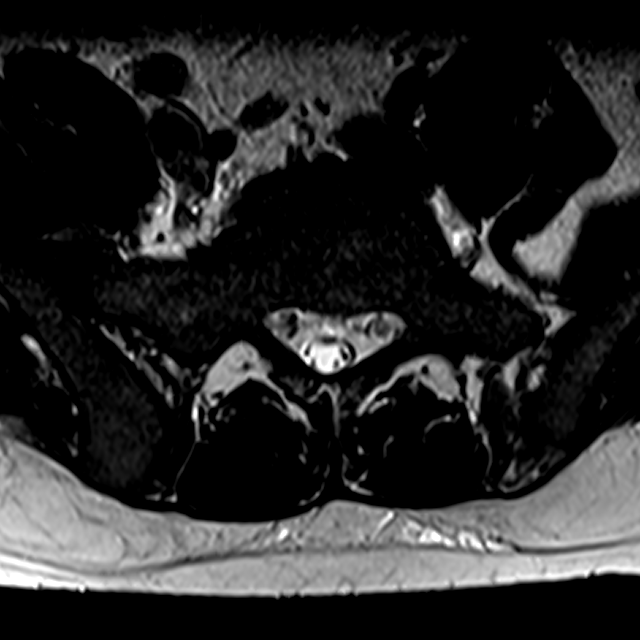
[im 21/42]
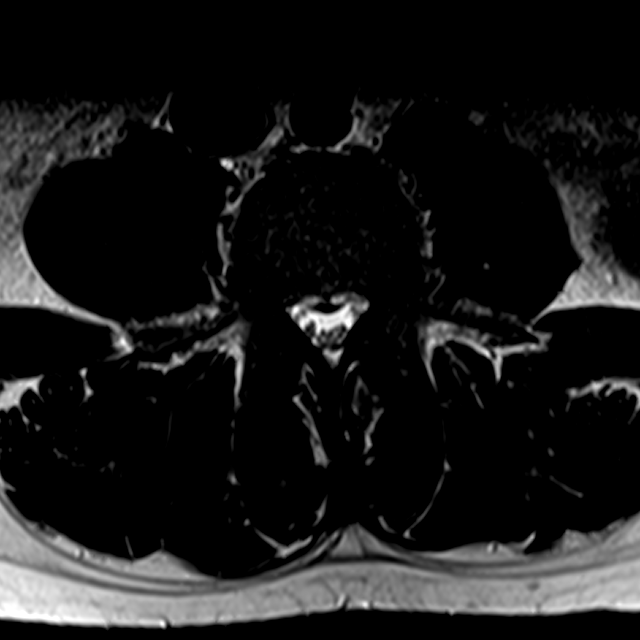
[im 36/42]
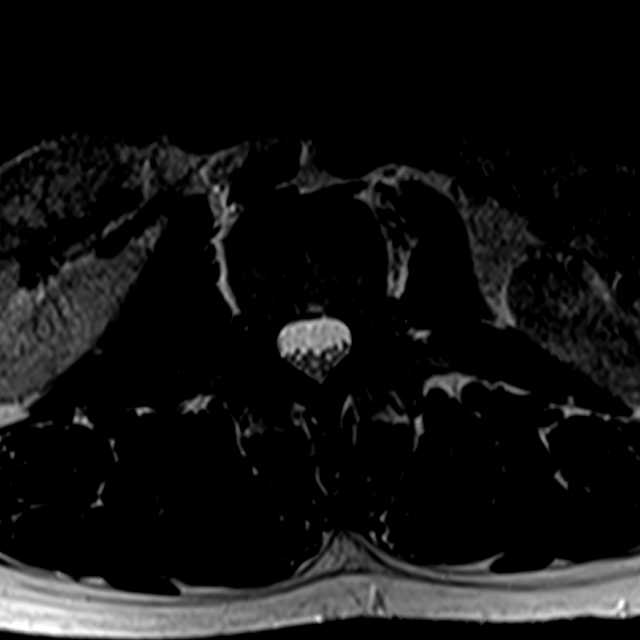

[Series 7: T1 · axial · 4.0mm · 0.24mm/px · z∈[-110,+38]mm · 3 of 42 slices shown (2 of 2)]
[im 6/42]
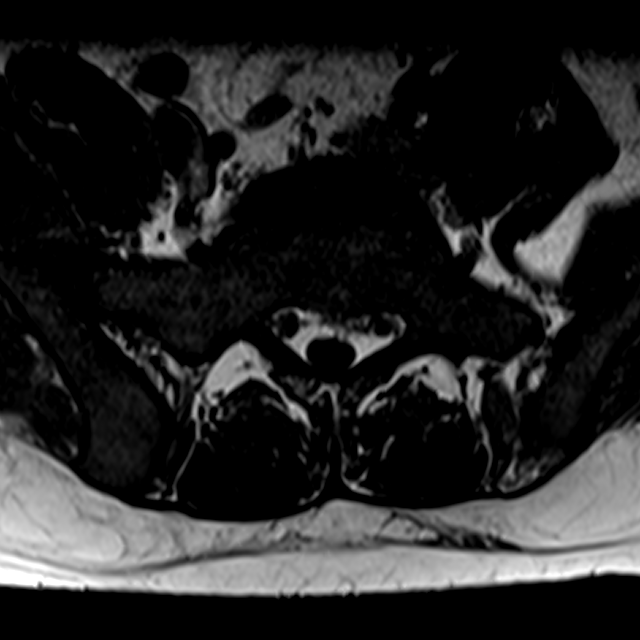
[im 21/42]
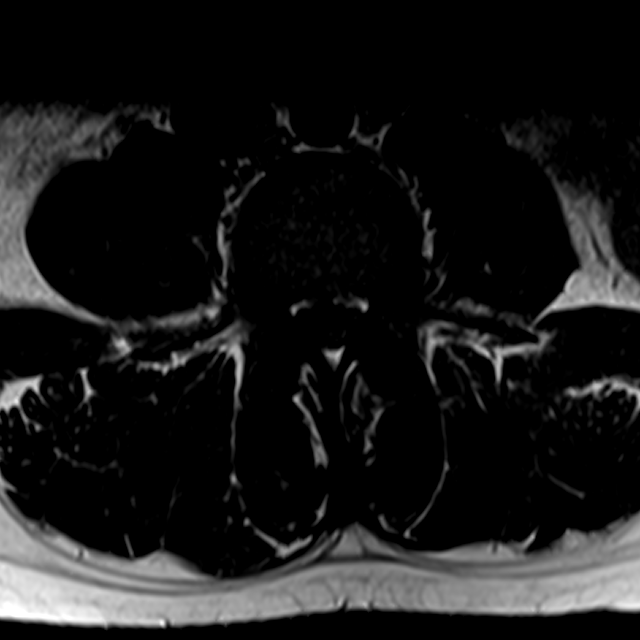
[im 36/42]
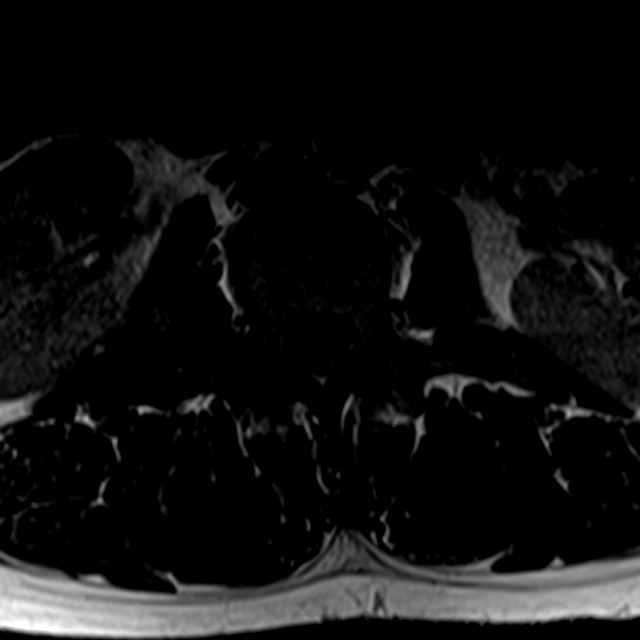

[13 of 48 positions shown; findings below may reference images not displayed]

FINDINGS: Segmentation:  Standard.

Alignment:  Physiologic.

Vertebrae:  No fracture, evidence of discitis, or bone lesion.

Conus medullaris and cauda equina: Conus extends to the L1 level.
Conus and cauda equina appear normal.

Paraspinal and other soft tissues: Negative.

Disc levels:

L1-L2: No significant disc protrusion, foraminal stenosis, or canal
stenosis.

L2-L3: No significant disc protrusion, foraminal stenosis, or canal
stenosis.

L3-L4: Mild disc desiccation and diffuse disc bulge. Mild bilateral
facet arthrosis. Mild canal stenosis. No foraminal stenosis.

L4-L5: Large posterior disc extrusion with flattening of the ventral
thecal sac. Posterior annular fissure. In addition there is mild
bilateral facet arthrosis and ligamentum flavum buckling resulting
in severe canal stenosis without foraminal stenosis. The disc
protrusion is elevates the posterior longitudinal ligament off of
the posterior L4 and L5 vertebral bodies with interposed T1 and T2
heterogeneously hyperintense signal within this space, which may
represent a combination of prominent venous plexus and/or a small
amount of blood product.

L5-S1: Left paracentral disc protrusion with 4-5 mm of caudal
extension narrows the left subarticular recess. Posterior annular
fissure. Mild bilateral facet arthrosis resulting in mild bilateral
foraminal stenosis. No canal stenosis.
IMPRESSION: 1. Large L4-5 posterior disc extrusion resulting in severe canal
stenosis.
2. Additional left paracentral disc protrusion at L5-S1 narrows the
left subarticular recess. Correlate for left S1 radiculopathy.
